# Patient Record
Sex: Male | Born: 2005 | Race: White | Hispanic: No | Marital: Single | Smoking: Never smoker
Health system: Southern US, Community
[De-identification: ages and names within clinical notes are randomized; demographics above are authoritative.]

## PROBLEM LIST (undated history)

## (undated) DIAGNOSIS — F909 Attention-deficit hyperactivity disorder, unspecified type: Secondary | ICD-10-CM

## (undated) HISTORY — DX: Attention-deficit hyperactivity disorder, unspecified type: F90.9

---
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
## 2023-02-15 NOTE — Patient Instructions (Signed)

## 2023-02-16 ENCOUNTER — Other Ambulatory Visit: Payer: Self-pay | Admitting: Family Medicine

## 2023-02-16 ENCOUNTER — Ambulatory Visit (INDEPENDENT_AMBULATORY_CARE_PROVIDER_SITE_OTHER): Payer: 59 | Admitting: Family Medicine

## 2023-02-16 ENCOUNTER — Encounter: Payer: Self-pay | Admitting: Family Medicine

## 2023-02-16 VITALS — BP 108/74 | HR 77 | Ht 70.0 in | Wt 261.4 lb

## 2023-02-16 DIAGNOSIS — Z0000 Encounter for general adult medical examination without abnormal findings: Secondary | ICD-10-CM

## 2023-02-16 DIAGNOSIS — H547 Unspecified visual loss: Secondary | ICD-10-CM | POA: Diagnosis not present

## 2023-02-16 NOTE — Progress Notes (Signed)
Office Note 02/16/2023  CC:  Chief Complaint  Patient presents with   Annual Exam   HPI:  Patient is a 17 y.o. male who is here for annual health maintenance exam. He feels well. He notes that gradually his visual acuity at night has diminished.  During the daytime he feels like it is all fine. Feels like both eyes are the same.  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  Past Medical History:  Diagnosis Date   ADHD (attention deficit hyperactivity disorder)     History reviewed. No pertinent surgical history.  Family History  Problem Relation Age of Onset   Hypertension Maternal Aunt    Hypertension Maternal Uncle    Ovarian cancer Maternal Grandmother    Hypertension Maternal Grandfather    Diabetes Neg Hx    Stroke Neg Hx     Social History   Socioeconomic History   Marital status: Single    Spouse name: Not on file   Number of children: Not on file   Years of education: Not on file   Highest education level: Not on file  Occupational History   Not on file  Tobacco Use   Smoking status: Never   Smokeless tobacco: Never  Vaping Use   Vaping status: Never Used  Substance and Sexual Activity   Alcohol use: No   Drug use: No   Sexual activity: Not on file  Other Topics Concern   Not on file  Social History Narrative   [employer].   Parents divorced 2021.  Has 2 bro, 1 sister.   Soccer.  Band: drums.   Social Determinants of Health   Financial Resource Strain: Not on file  Food Insecurity: Not on file  Transportation Needs: Not on file  Physical Activity: Not on file  Stress: Not on file  Social Connections: Unknown (11/25/2021)   Received from [facility]   Social Network    Social Network: Not on file  Intimate Partner Violence: Unknown (10/26/2021)   Received from [facility]   HITS    Physically Hurt: Not on file    Insult or Talk Down To: Not on file    Threaten Physical Harm: Not on file    Scream or Curse: Not  on file    Outpatient Medications Prior to Visit  Medication Sig Dispense Refill   ibuprofen (ADVIL) 600 MG tablet Take 1 tablet (600 mg total) by mouth every 6 (six) hours as needed. 30 tablet 0   No facility-administered medications prior to visit.    No Known Allergies  Review of Systems  Constitutional:  Negative for appetite change, chills, fatigue and fever.  HENT:  Negative for congestion, dental problem, ear pain and sore throat.   Eyes:  Positive for visual disturbance (blurry/decreased acuity at night). Negative for discharge and redness.  Respiratory:  Negative for cough, chest tightness, shortness of breath and wheezing.   Cardiovascular:  Negative for chest pain, palpitations and leg swelling.  Gastrointestinal:  Negative for abdominal pain, blood in stool, diarrhea, nausea and vomiting.  Genitourinary:  Negative for difficulty urinating, dysuria, flank pain, frequency, hematuria and urgency.  Musculoskeletal:  Negative for arthralgias, back pain, joint swelling, myalgias and neck stiffness.  Skin:  Negative for pallor and rash.  Neurological:  Negative for dizziness, speech difficulty, weakness and headaches.  Hematological:  Negative for adenopathy. Does not bruise/bleed easily.  Psychiatric/Behavioral:  Negative for confusion and sleep disturbance. The patient is not nervous/anxious.  PE;    02/16/2023    9:03 AM 03/15/2021    1:39 PM 03/15/2021    1:38 PM  Vitals with BMI  Height 5\' 10"     Weight 261 lbs 6 oz 238 lbs 5 oz   BMI 37.51    Systolic 108  132  Diastolic 74  80  Pulse 77  75  Body mass index is 37.51 kg/m.  Gen: Alert, well appearing.  Patient is oriented to person, place, time, and situation. AFFECT: pleasant, lucid thought and speech. ENT: Ears: EACs clear, normal epithelium.  TMs with good light reflex and landmarks bilaterally.  Eyes: no injection, icteris, swelling, or exudate.  EOMI, PERRLA. Nose: no drainage or turbinate edema/swelling.   No injection or focal lesion.  Mouth: lips without lesion/swelling.  Oral mucosa pink and moist.  Dentition intact and without obvious caries or gingival swelling.  Oropharynx without erythema, exudate, or swelling.  Neck: supple/nontender.  No LAD, mass, or TM.  Carotid pulses 2+ bilaterally, without bruits. CV: RRR, no m/r/g.   LUNGS: CTA bilat, nonlabored resps, good aeration in all lung fields. ABD: soft, NT, ND, BS normal.  No hepatospenomegaly or mass.  No bruits. EXT: no clubbing, cyanosis, or edema.  Musculoskeletal: no joint swelling, erythema, warmth, or tenderness.  ROM of all joints intact. Skin - no sores or suspicious lesions or rashes or color changes  Pertinent labs:  None  ASSESSMENT AND PLAN:   #1 health maintenance exam: Reviewed age and gender appropriate health maintenance issues (prudent diet, regular exercise, health risks of tobacco and excessive alcohol, use of seatbelts, fire alarms in home, use of sunscreen).  Also reviewed age and gender appropriate health screening as well as vaccine recommendations. Vaccines: Gardasil-->declined.  Otherwise all UTD. Labs: none  #2 decreased visual acuity at night. Visual acuity testing here in the office today showed 2020 acuity in each separate and both eyes together without correction today. Encouraged him to go ahead and get a formal eye exam with an optometrist.  An After Visit Summary was printed and given to the patient.  FOLLOW UP:  Return in about 1 year (around 02/16/2024) for annual CPE (fasting).  Signed:  Santiago Bumpers, MD           02/16/2023
